# Patient Record
Sex: Male | Born: 1955 | Race: White | Hispanic: No | Marital: Married | Smoking: Current every day smoker
Health system: Southern US, Community
[De-identification: ages and names within clinical notes are randomized; demographics above are authoritative.]

## PROBLEM LIST (undated history)

## (undated) DIAGNOSIS — K279 Peptic ulcer, site unspecified, unspecified as acute or chronic, without hemorrhage or perforation: Secondary | ICD-10-CM

## (undated) DIAGNOSIS — K7460 Unspecified cirrhosis of liver: Secondary | ICD-10-CM

## (undated) DIAGNOSIS — I10 Essential (primary) hypertension: Secondary | ICD-10-CM

## (undated) DIAGNOSIS — K2990 Gastroduodenitis, unspecified, without bleeding: Secondary | ICD-10-CM

## (undated) DIAGNOSIS — E119 Type 2 diabetes mellitus without complications: Secondary | ICD-10-CM

## (undated) DIAGNOSIS — K219 Gastro-esophageal reflux disease without esophagitis: Secondary | ICD-10-CM

## (undated) HISTORY — PX: KNEE ARTHROSCOPY: SHX127

---

## 2002-02-11 ENCOUNTER — Ambulatory Visit (HOSPITAL_COMMUNITY): Admission: RE | Admit: 2002-02-11 | Discharge: 2002-02-11 | Payer: Self-pay | Admitting: Orthopaedic Surgery

## 2002-02-11 ENCOUNTER — Encounter: Payer: Self-pay | Admitting: Orthopaedic Surgery

## 2014-07-05 ENCOUNTER — Inpatient Hospital Stay (HOSPITAL_COMMUNITY)
Admission: EM | Admit: 2014-07-05 | Discharge: 2014-07-08 | DRG: 638 | Disposition: A | Discharge status: Home / Self Care | Payer: No Typology Code available for payment source | Attending: Internal Medicine | Admitting: Internal Medicine

## 2014-07-05 ENCOUNTER — Encounter (HOSPITAL_COMMUNITY): Payer: Self-pay | Admitting: Emergency Medicine

## 2014-07-05 DIAGNOSIS — D294 Benign neoplasm of scrotum: Secondary | ICD-10-CM | POA: Diagnosis present

## 2014-07-05 DIAGNOSIS — Z8711 Personal history of peptic ulcer disease: Secondary | ICD-10-CM

## 2014-07-05 DIAGNOSIS — R7402 Elevation of levels of lactic acid dehydrogenase (LDH): Secondary | ICD-10-CM | POA: Diagnosis present

## 2014-07-05 DIAGNOSIS — K7460 Unspecified cirrhosis of liver: Secondary | ICD-10-CM | POA: Diagnosis present

## 2014-07-05 DIAGNOSIS — K279 Peptic ulcer, site unspecified, unspecified as acute or chronic, without hemorrhage or perforation: Secondary | ICD-10-CM | POA: Diagnosis present

## 2014-07-05 DIAGNOSIS — Z789 Other specified health status: Secondary | ICD-10-CM

## 2014-07-05 DIAGNOSIS — K2990 Gastroduodenitis, unspecified, without bleeding: Secondary | ICD-10-CM | POA: Diagnosis present

## 2014-07-05 DIAGNOSIS — E669 Obesity, unspecified: Secondary | ICD-10-CM | POA: Diagnosis present

## 2014-07-05 DIAGNOSIS — K920 Hematemesis: Secondary | ICD-10-CM

## 2014-07-05 DIAGNOSIS — K2970 Gastritis, unspecified, without bleeding: Secondary | ICD-10-CM | POA: Diagnosis present

## 2014-07-05 DIAGNOSIS — E119 Type 2 diabetes mellitus without complications: Secondary | ICD-10-CM | POA: Diagnosis present

## 2014-07-05 DIAGNOSIS — R7401 Elevation of levels of liver transaminase levels: Secondary | ICD-10-CM | POA: Diagnosis present

## 2014-07-05 DIAGNOSIS — E1310 Other specified diabetes mellitus with ketoacidosis without coma: Principal | ICD-10-CM

## 2014-07-05 DIAGNOSIS — R1013 Epigastric pain: Secondary | ICD-10-CM | POA: Diagnosis present

## 2014-07-05 DIAGNOSIS — K219 Gastro-esophageal reflux disease without esophagitis: Secondary | ICD-10-CM | POA: Diagnosis present

## 2014-07-05 DIAGNOSIS — D72829 Elevated white blood cell count, unspecified: Secondary | ICD-10-CM | POA: Diagnosis present

## 2014-07-05 DIAGNOSIS — R740 Nonspecific elevation of levels of transaminase and lactic acid dehydrogenase [LDH]: Secondary | ICD-10-CM

## 2014-07-05 DIAGNOSIS — K7030 Alcoholic cirrhosis of liver without ascites: Secondary | ICD-10-CM

## 2014-07-05 DIAGNOSIS — F1090 Alcohol use, unspecified, uncomplicated: Secondary | ICD-10-CM | POA: Diagnosis present

## 2014-07-05 DIAGNOSIS — E861 Hypovolemia: Secondary | ICD-10-CM | POA: Diagnosis present

## 2014-07-05 DIAGNOSIS — R1011 Right upper quadrant pain: Secondary | ICD-10-CM

## 2014-07-05 DIAGNOSIS — E860 Dehydration: Secondary | ICD-10-CM | POA: Diagnosis present

## 2014-07-05 DIAGNOSIS — D696 Thrombocytopenia, unspecified: Secondary | ICD-10-CM | POA: Diagnosis present

## 2014-07-05 DIAGNOSIS — Z6829 Body mass index (BMI) 29.0-29.9, adult: Secondary | ICD-10-CM

## 2014-07-05 DIAGNOSIS — K2980 Duodenitis without bleeding: Secondary | ICD-10-CM | POA: Diagnosis present

## 2014-07-05 DIAGNOSIS — I8510 Secondary esophageal varices without bleeding: Secondary | ICD-10-CM

## 2014-07-05 DIAGNOSIS — R112 Nausea with vomiting, unspecified: Secondary | ICD-10-CM | POA: Diagnosis present

## 2014-07-05 DIAGNOSIS — I10 Essential (primary) hypertension: Secondary | ICD-10-CM | POA: Diagnosis present

## 2014-07-05 DIAGNOSIS — E1110 Type 2 diabetes mellitus with ketoacidosis without coma: Secondary | ICD-10-CM | POA: Diagnosis present

## 2014-07-05 DIAGNOSIS — D6959 Other secondary thrombocytopenia: Secondary | ICD-10-CM | POA: Diagnosis present

## 2014-07-05 DIAGNOSIS — F1020 Alcohol dependence, uncomplicated: Secondary | ICD-10-CM | POA: Diagnosis present

## 2014-07-05 DIAGNOSIS — F17200 Nicotine dependence, unspecified, uncomplicated: Secondary | ICD-10-CM | POA: Diagnosis present

## 2014-07-05 DIAGNOSIS — Z7289 Other problems related to lifestyle: Secondary | ICD-10-CM

## 2014-07-05 DIAGNOSIS — K319 Disease of stomach and duodenum, unspecified: Secondary | ICD-10-CM | POA: Diagnosis present

## 2014-07-05 HISTORY — DX: Gastroduodenitis, unspecified, without bleeding: K29.90

## 2014-07-05 HISTORY — DX: Peptic ulcer, site unspecified, unspecified as acute or chronic, without hemorrhage or perforation: K27.9

## 2014-07-05 HISTORY — DX: Gastro-esophageal reflux disease without esophagitis: K21.9

## 2014-07-05 HISTORY — DX: Type 2 diabetes mellitus without complications: E11.9

## 2014-07-05 HISTORY — DX: Unspecified cirrhosis of liver: K74.60

## 2014-07-05 HISTORY — DX: Essential (primary) hypertension: I10

## 2014-07-05 MED ORDER — SODIUM CHLORIDE 0.9 % IV BOLUS (SEPSIS)
1000.0000 mL | Freq: Once | INTRAVENOUS | Status: AC
  Administered 2014-07-06: 1000 mL via INTRAVENOUS

## 2014-07-05 MED ORDER — MORPHINE SULFATE 2 MG/ML IJ SOLN
4.0000 mg | Freq: Once | INTRAMUSCULAR | Status: AC
  Administered 2014-07-06: 4 mg via INTRAVENOUS
  Filled 2014-07-05: qty 2

## 2014-07-05 MED ORDER — ONDANSETRON HCL 4 MG/2ML IJ SOLN
4.0000 mg | Freq: Once | INTRAMUSCULAR | Status: DC
  Filled 2014-07-05: qty 2

## 2014-07-05 NOTE — ED Notes (Signed)
Pt states he has had some right lower abd pain with vomiting.  Pt states the pain got worse today
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
## 2014-07-06 ENCOUNTER — Encounter (HOSPITAL_COMMUNITY): Payer: Self-pay | Admitting: *Deleted

## 2014-07-06 ENCOUNTER — Inpatient Hospital Stay (HOSPITAL_COMMUNITY): Payer: No Typology Code available for payment source

## 2014-07-06 ENCOUNTER — Emergency Department (HOSPITAL_COMMUNITY): Payer: No Typology Code available for payment source

## 2014-07-06 DIAGNOSIS — Z7289 Other problems related to lifestyle: Secondary | ICD-10-CM | POA: Diagnosis present

## 2014-07-06 DIAGNOSIS — K7460 Unspecified cirrhosis of liver: Secondary | ICD-10-CM | POA: Diagnosis present

## 2014-07-06 DIAGNOSIS — R112 Nausea with vomiting, unspecified: Secondary | ICD-10-CM | POA: Diagnosis present

## 2014-07-06 DIAGNOSIS — D294 Benign neoplasm of scrotum: Secondary | ICD-10-CM | POA: Diagnosis present

## 2014-07-06 DIAGNOSIS — E1110 Type 2 diabetes mellitus with ketoacidosis without coma: Secondary | ICD-10-CM | POA: Diagnosis present

## 2014-07-06 DIAGNOSIS — K2980 Duodenitis without bleeding: Secondary | ICD-10-CM | POA: Diagnosis present

## 2014-07-06 DIAGNOSIS — K279 Peptic ulcer, site unspecified, unspecified as acute or chronic, without hemorrhage or perforation: Secondary | ICD-10-CM | POA: Diagnosis present

## 2014-07-06 DIAGNOSIS — R110 Nausea: Secondary | ICD-10-CM

## 2014-07-06 DIAGNOSIS — Z789 Other specified health status: Secondary | ICD-10-CM | POA: Diagnosis present

## 2014-07-06 DIAGNOSIS — R740 Nonspecific elevation of levels of transaminase and lactic acid dehydrogenase [LDH]: Secondary | ICD-10-CM

## 2014-07-06 DIAGNOSIS — K920 Hematemesis: Secondary | ICD-10-CM

## 2014-07-06 DIAGNOSIS — D696 Thrombocytopenia, unspecified: Secondary | ICD-10-CM | POA: Diagnosis present

## 2014-07-06 DIAGNOSIS — E1310 Other specified diabetes mellitus with ketoacidosis without coma: Principal | ICD-10-CM

## 2014-07-06 DIAGNOSIS — E119 Type 2 diabetes mellitus without complications: Secondary | ICD-10-CM | POA: Diagnosis present

## 2014-07-06 DIAGNOSIS — I10 Essential (primary) hypertension: Secondary | ICD-10-CM | POA: Diagnosis present

## 2014-07-06 DIAGNOSIS — R1013 Epigastric pain: Secondary | ICD-10-CM

## 2014-07-06 DIAGNOSIS — R7401 Elevation of levels of liver transaminase levels: Secondary | ICD-10-CM | POA: Diagnosis present

## 2014-07-06 DIAGNOSIS — R1011 Right upper quadrant pain: Secondary | ICD-10-CM

## 2014-07-06 LAB — BASIC METABOLIC PANEL
Anion gap: 23 — ABNORMAL HIGH (ref 5–15)
Anion gap: 18 — ABNORMAL HIGH (ref 5–15)
Anion gap: 16 — ABNORMAL HIGH (ref 5–15)
Anion gap: 11 (ref 5–15)
BUN: 22 mg/dL (ref 6–23)
BUN: 22 mg/dL (ref 6–23)
BUN: 22 mg/dL (ref 6–23)
BUN: 21 mg/dL (ref 6–23)
BUN: 20 mg/dL (ref 6–23)
CALCIUM: 9.3 mg/dL (ref 8.4–10.5)
CALCIUM: 9.4 mg/dL (ref 8.4–10.5)
CO2: 17 mEq/L — ABNORMAL LOW (ref 19–32)
CO2: 17 mEq/L — ABNORMAL LOW (ref 19–32)
CO2: 21 mEq/L (ref 19–32)
CO2: 21 mEq/L (ref 19–32)
CO2: 24 mEq/L (ref 19–32)
CREATININE: 0.86 mg/dL (ref 0.50–1.35)
CREATININE: 0.77 mg/dL (ref 0.50–1.35)
Calcium: 9.4 mg/dL (ref 8.4–10.5)
Calcium: 9.3 mg/dL (ref 8.4–10.5)
Calcium: 9.1 mg/dL (ref 8.4–10.5)
Chloride: 97 mEq/L (ref 96–112)
Chloride: 100 mEq/L (ref 96–112)
Chloride: 101 mEq/L (ref 96–112)
Chloride: 105 mEq/L (ref 96–112)
Chloride: 105 mEq/L (ref 96–112)
Creatinine, Ser: 0.87 mg/dL (ref 0.50–1.35)
Creatinine, Ser: 0.87 mg/dL (ref 0.50–1.35)
Creatinine, Ser: 0.83 mg/dL (ref 0.50–1.35)
GFR calc Af Amer: >90 mL/min (ref >90)
GFR calc Af Amer: >90 mL/min (ref >90)
GFR calc non Af Amer: >90 mL/min (ref >90)
GFR calc non Af Amer: >90 mL/min (ref >90)
GFR calc non Af Amer: >90 mL/min (ref >90)
GFR calc non Af Amer: >90 mL/min (ref >90)
GFR calc non Af Amer: >90 mL/min (ref >90)
GLUCOSE: 338 mg/dL — AB (ref 70–99)
GLUCOSE: 334 mg/dL — AB (ref 70–99)
Glucose, Bld: 278 mg/dL — ABNORMAL HIGH (ref 70–99)
Glucose, Bld: 237 mg/dL — ABNORMAL HIGH (ref 70–99)
Glucose, Bld: 179 mg/dL — ABNORMAL HIGH (ref 70–99)
POTASSIUM: 4.4 meq/L (ref 3.7–5.3)
Potassium: 4.5 mEq/L (ref 3.7–5.3)
Potassium: 4.6 mEq/L (ref 3.7–5.3)
Potassium: 4.3 mEq/L (ref 3.7–5.3)
Potassium: 4 mEq/L (ref 3.7–5.3)
Sodium: 137 mEq/L (ref 137–147)
Sodium: 140 mEq/L (ref 137–147)
Sodium: 140 mEq/L (ref 137–147)
Sodium: 142 mEq/L (ref 137–147)
Sodium: 140 mEq/L (ref 137–147)

## 2014-07-06 LAB — COMPREHENSIVE METABOLIC PANEL
ALT: 49 U/L (ref 0–53)
AST: 51 U/L — ABNORMAL HIGH (ref 0–37)
Albumin: 4.3 g/dL (ref 3.5–5.2)
Alkaline Phosphatase: 174 U/L — ABNORMAL HIGH (ref 39–117)
Anion gap: 22 — ABNORMAL HIGH (ref 5–15)
BUN: 23 mg/dL (ref 6–23)
CO2: 19 mEq/L (ref 19–32)
Calcium: 10 mg/dL (ref 8.4–10.5)
Chloride: 98 mEq/L (ref 96–112)
Creatinine, Ser: 1 mg/dL (ref 0.50–1.35)
GFR calc Af Amer: >90 mL/min (ref >90)
GFR calc non Af Amer: 82 mL/min — ABNORMAL LOW (ref >90)
Glucose, Bld: 343 mg/dL — ABNORMAL HIGH (ref 70–99)
Potassium: 4.3 mEq/L (ref 3.7–5.3)
Sodium: 139 mEq/L (ref 137–147)
Total Bilirubin: 2.3 mg/dL — ABNORMAL HIGH (ref 0.3–1.2)
Total Protein: 7.7 g/dL (ref 6.0–8.3)

## 2014-07-06 LAB — PROTIME-INR
INR: 1.28 (ref 0.00–1.49)
Prothrombin Time: 16 seconds — ABNORMAL HIGH (ref 11.6–15.2)

## 2014-07-06 LAB — URINALYSIS, ROUTINE W REFLEX MICROSCOPIC
Bilirubin Urine: NEGATIVE
HGB URINE DIPSTICK: NEGATIVE
Ketones, ur: 15 mg/dL — AB
Leukocytes, UA: NEGATIVE
Nitrite: NEGATIVE
PH: 5.5 (ref 5.0–8.0)
Protein, ur: NEGATIVE mg/dL
SPECIFIC GRAVITY, URINE: 1.01 (ref 1.005–1.030)
Urobilinogen, UA: 0.2 mg/dL (ref 0.0–1.0)

## 2014-07-06 LAB — GLUCOSE, CAPILLARY
GLUCOSE-CAPILLARY: 336 mg/dL — AB (ref 70–99)
GLUCOSE-CAPILLARY: 283 mg/dL — AB (ref 70–99)
GLUCOSE-CAPILLARY: 198 mg/dL — AB (ref 70–99)
GLUCOSE-CAPILLARY: 184 mg/dL — AB (ref 70–99)
GLUCOSE-CAPILLARY: 152 mg/dL — AB (ref 70–99)
Glucose-Capillary: 334 mg/dL — ABNORMAL HIGH (ref 70–99)
Glucose-Capillary: 289 mg/dL — ABNORMAL HIGH (ref 70–99)
Glucose-Capillary: 227 mg/dL — ABNORMAL HIGH (ref 70–99)
Glucose-Capillary: 173 mg/dL — ABNORMAL HIGH (ref 70–99)
Glucose-Capillary: 266 mg/dL — ABNORMAL HIGH (ref 70–99)
Glucose-Capillary: 190 mg/dL — ABNORMAL HIGH (ref 70–99)
Glucose-Capillary: 189 mg/dL — ABNORMAL HIGH (ref 70–99)

## 2014-07-06 LAB — HEPATITIS PANEL, ACUTE
HCV Ab: NEGATIVE
HEP B S AG: NEGATIVE
Hep A IgM: NONREACTIVE
Hep B C IgM: NONREACTIVE

## 2014-07-06 LAB — CBC WITH DIFFERENTIAL/PLATELET
Basophils Absolute: 0 10*3/uL (ref 0.0–0.1)
Basophils Relative: 0 % (ref 0–1)
Eosinophils Absolute: 0 10*3/uL (ref 0.0–0.7)
Eosinophils Relative: 0 % (ref 0–5)
HCT: 43.9 % (ref 39.0–52.0)
Hemoglobin: 15.5 g/dL (ref 13.0–17.0)
Lymphocytes Relative: 7 % — ABNORMAL LOW (ref 12–46)
Lymphs Abs: 0.8 10*3/uL (ref 0.7–4.0)
MCH: 32.3 pg (ref 26.0–34.0)
MCHC: 35.3 g/dL (ref 30.0–36.0)
MCV: 91.5 fL (ref 78.0–100.0)
Monocytes Absolute: 0.4 10*3/uL (ref 0.1–1.0)
Monocytes Relative: 4 % (ref 3–12)
Neutro Abs: 10.4 10*3/uL — ABNORMAL HIGH (ref 1.7–7.7)
Neutrophils Relative %: 89 % — ABNORMAL HIGH (ref 43–77)
Platelets: 96 10*3/uL — ABNORMAL LOW (ref 150–400)
RBC: 4.8 MIL/uL (ref 4.22–5.81)
RDW: 14.1 % (ref 11.5–15.5)
WBC: 11.6 10*3/uL — ABNORMAL HIGH (ref 4.0–10.5)

## 2014-07-06 LAB — URINE MICROSCOPIC-ADD ON

## 2014-07-06 LAB — CBC
HCT: 40.1 % (ref 39.0–52.0)
HEMOGLOBIN: 13.8 g/dL (ref 13.0–17.0)
MCH: 31.7 pg (ref 26.0–34.0)
MCHC: 34.4 g/dL (ref 30.0–36.0)
MCV: 92 fL (ref 78.0–100.0)
Platelets: 92 10*3/uL — ABNORMAL LOW (ref 150–400)
RBC: 4.36 MIL/uL (ref 4.22–5.81)
RDW: 14.3 % (ref 11.5–15.5)
WBC: 13.5 10*3/uL — ABNORMAL HIGH (ref 4.0–10.5)

## 2014-07-06 LAB — CBG MONITORING, ED
Glucose-Capillary: 312 mg/dL — ABNORMAL HIGH (ref 70–99)
Glucose-Capillary: 319 mg/dL — ABNORMAL HIGH (ref 70–99)

## 2014-07-06 LAB — TSH: TSH: 0.824 u[IU]/mL (ref 0.350–4.500)

## 2014-07-06 LAB — LIPASE, BLOOD: Lipase: 66 U/L — ABNORMAL HIGH (ref 11–59)

## 2014-07-06 LAB — BILIRUBIN, DIRECT: BILIRUBIN DIRECT: 0.9 mg/dL — AB (ref 0.0–0.3)

## 2014-07-06 LAB — HEPATITIS A ANTIBODY, TOTAL: HEP A TOTAL AB: NONREACTIVE

## 2014-07-06 LAB — HEMOGLOBIN A1C
Hgb A1c MFr Bld: 9.1 % — ABNORMAL HIGH (ref <5.7)
Mean Plasma Glucose: 214 mg/dL — ABNORMAL HIGH (ref <117)

## 2014-07-06 LAB — VITAMIN B12: VITAMIN B 12: 895 pg/mL (ref 211–911)

## 2014-07-06 LAB — HEPATITIS B SURFACE ANTIGEN: HEP B S AG: NEGATIVE

## 2014-07-06 LAB — ETHANOL

## 2014-07-06 LAB — MRSA PCR SCREENING: MRSA by PCR: NEGATIVE

## 2014-07-06 LAB — FERRITIN: Ferritin: 66 ng/mL (ref 22–322)

## 2014-07-06 LAB — HEPATITIS C ANTIBODY: HCV AB: NEGATIVE

## 2014-07-06 LAB — TROPONIN I: Troponin I: <0.3 ng/mL (ref <0.30)

## 2014-07-06 MED ORDER — SODIUM CHLORIDE 0.9 % IV SOLN
INTRAVENOUS | Status: DC
  Administered 2014-07-06: 2.6 [IU]/h via INTRAVENOUS
  Filled 2014-07-06: qty 1

## 2014-07-06 MED ORDER — MORPHINE SULFATE 2 MG/ML IJ SOLN: 2.0000 mg | INTRAMUSCULAR | Status: DC | PRN

## 2014-07-06 MED ORDER — SODIUM CHLORIDE 0.9 % IV SOLN
INTRAVENOUS | Status: DC
  Administered 2014-07-06: 05:00:00 via INTRAVENOUS

## 2014-07-06 MED ORDER — PANTOPRAZOLE SODIUM 40 MG IV SOLR: 40.0000 mg | Freq: Two times a day (BID) | INTRAVENOUS | Status: DC

## 2014-07-06 MED ORDER — PNEUMOCOCCAL VAC POLYVALENT 25 MCG/0.5ML IJ INJ
0.5000 mL | INJECTION | INTRAMUSCULAR | Status: AC
  Administered 2014-07-07: 0.5 mL via INTRAMUSCULAR
  Filled 2014-07-06: qty 0.5

## 2014-07-06 MED ORDER — PANTOPRAZOLE SODIUM 40 MG PO TBEC
40.0000 mg | DELAYED_RELEASE_TABLET | Freq: Two times a day (BID) | ORAL | Status: DC
  Administered 2014-07-06 – 2014-07-08 (×4): 40 mg via ORAL
  Filled 2014-07-06 (×4): qty 1

## 2014-07-06 MED ORDER — POTASSIUM CHLORIDE 10 MEQ/100ML IV SOLN
10.0000 meq | Freq: Once | INTRAVENOUS | Status: AC
  Administered 2014-07-06: 10 meq via INTRAVENOUS
  Filled 2014-07-06: qty 100

## 2014-07-06 MED ORDER — ONDANSETRON HCL 4 MG/2ML IJ SOLN: 4.0000 mg | Freq: Four times a day (QID) | INTRAMUSCULAR | Status: DC | PRN

## 2014-07-06 MED ORDER — IBUPROFEN 400 MG PO TABS
600.0000 mg | ORAL_TABLET | Freq: Four times a day (QID) | ORAL | Status: AC | PRN
  Administered 2014-07-06 – 2014-07-07 (×2): 600 mg via ORAL
  Filled 2014-07-06 (×2): qty 2

## 2014-07-06 MED ORDER — ALUM & MAG HYDROXIDE-SIMETH 200-200-20 MG/5ML PO SUSP
30.0000 mL | Freq: Four times a day (QID) | ORAL | Status: DC | PRN
  Administered 2014-07-08: 30 mL via ORAL
  Filled 2014-07-06: qty 30

## 2014-07-06 MED ORDER — DEXTROSE-NACL 5-0.45 % IV SOLN: INTRAVENOUS | Status: DC

## 2014-07-06 MED ORDER — SODIUM CHLORIDE 0.9 % IV BOLUS (SEPSIS)
1000.0000 mL | Freq: Once | INTRAVENOUS | Status: AC
  Administered 2014-07-06: 1000 mL via INTRAVENOUS

## 2014-07-06 MED ORDER — POTASSIUM CHLORIDE IN NACL 20-0.9 MEQ/L-% IV SOLN
INTRAVENOUS | Status: DC
  Administered 2014-07-06: 18:00:00 via INTRAVENOUS

## 2014-07-06 MED ORDER — INSULIN ASPART 100 UNIT/ML ~~LOC~~ SOLN
0.0000 [IU] | Freq: Every day | SUBCUTANEOUS | Status: DC
  Administered 2014-07-07: 2 [IU] via SUBCUTANEOUS

## 2014-07-06 MED ORDER — DEXTROSE 50 % IV SOLN: 25.0000 mL | INTRAVENOUS | Status: DC | PRN

## 2014-07-06 MED ORDER — ONDANSETRON HCL 4 MG/2ML IJ SOLN
4.0000 mg | Freq: Once | INTRAMUSCULAR | Status: AC
  Administered 2014-07-06: 4 mg via INTRAVENOUS

## 2014-07-06 MED ORDER — IOHEXOL 300 MG/ML  SOLN
50.0000 mL | Freq: Once | INTRAMUSCULAR | Status: AC | PRN
  Administered 2014-07-06: 50 mL via ORAL

## 2014-07-06 MED ORDER — INSULIN GLARGINE 100 UNIT/ML ~~LOC~~ SOLN
12.0000 [IU] | Freq: Once | SUBCUTANEOUS | Status: AC
  Administered 2014-07-06: 12 [IU] via SUBCUTANEOUS
  Filled 2014-07-06: qty 0.12

## 2014-07-06 MED ORDER — ONDANSETRON HCL 4 MG PO TABS
4.0000 mg | ORAL_TABLET | Freq: Four times a day (QID) | ORAL | Status: DC | PRN
  Administered 2014-07-08: 4 mg via ORAL
  Filled 2014-07-06: qty 1

## 2014-07-06 MED ORDER — DEXTROSE-NACL 5-0.45 % IV SOLN
INTRAVENOUS | Status: DC
  Administered 2014-07-06: 10:00:00 via INTRAVENOUS

## 2014-07-06 MED ORDER — SODIUM CHLORIDE 0.9 % IV SOLN
INTRAVENOUS | Status: DC
  Administered 2014-07-06: 11.7 [IU]/h via INTRAVENOUS
  Filled 2014-07-06: qty 1

## 2014-07-06 MED ORDER — INSULIN ASPART 100 UNIT/ML ~~LOC~~ SOLN
0.0000 [IU] | Freq: Three times a day (TID) | SUBCUTANEOUS | Status: DC
  Administered 2014-07-07: 2 [IU] via SUBCUTANEOUS
  Administered 2014-07-07: 5 [IU] via SUBCUTANEOUS
  Administered 2014-07-08 (×2): 3 [IU] via SUBCUTANEOUS

## 2014-07-06 MED ORDER — PANTOPRAZOLE SODIUM 40 MG IV SOLR
40.0000 mg | Freq: Once | INTRAVENOUS | Status: AC
  Administered 2014-07-06: 40 mg via INTRAVENOUS
  Filled 2014-07-06: qty 40

## 2014-07-06 MED ORDER — IOHEXOL 300 MG/ML  SOLN
100.0000 mL | Freq: Once | INTRAMUSCULAR | Status: AC | PRN
  Administered 2014-07-06: 100 mL via INTRAVENOUS

## 2014-07-06 MED ORDER — BIOTENE DRY MOUTH MT LIQD
15.0000 mL | Freq: Two times a day (BID) | OROMUCOSAL | Status: DC
  Administered 2014-07-06 – 2014-07-08 (×4): 15 mL via OROMUCOSAL

## 2014-07-06 MED ORDER — POTASSIUM CHLORIDE 10 MEQ/100ML IV SOLN
10.0000 meq | INTRAVENOUS | Status: AC
  Administered 2014-07-06 (×2): 10 meq via INTRAVENOUS
  Filled 2014-07-06 (×2): qty 100

## 2014-07-06 NOTE — Progress Notes (Signed)
The patient is a 58 year old man with a history of non-insulin-dependent diabetes mellitus, peptic ulcer disease, and cirrhosis, who was admitted this morning by Dr. Shanon Brow for abdominal pain, nausea, and vomiting. He was briefly examined. His vital signs, laboratory studies, and chart were reviewed. CT of his abdomen and pelvis revealed duodenal wall thickening, cirrhosis with sequelae of portal hypotension, mild splenomegaly, cholelithiasis, and porta hepatis/gastrohepatic adenopathy. His laboratory studies were consistent with DKA. His anion gap is closing. We'll continue current management as outlined by Dr. Shanon Brow. GI consult ordered and is pending. We'll continue n.p.o. status until his DKA has completely resolved, then we'll start clear liquid diet. Will order TSH and hemoglobin A1c. Hepatitis panel is pending.

## 2014-07-06 NOTE — Consult Note (Signed)
Referring Provider: No ref. provider found Primary Care Physician:  Terrial Rhodes, MD Primary Gastroenterologist:  Barney Drain  Reason for Consultation:  CIRRHOSIS   Impression: 58 YO WITH HISTORYOF CIRRHOSIS SINCE ~?2003. WAS TOLD TO STOP DRINKING ETOH BUT CONTINUES TO DRINK ETOH BUT NOT EVERY DAY. PRSENTED WITH NV/ABDOMINAL PAIN/ETOH/GOODY POWDER USE/WEIGHT LOSS. DIFFERENTIAL DIAGNOSIS INCLUDES PUD, GASTRIC OUTLET OBSTRUCTION, LESS LIKELY GASTRIC MASS.  Plan: 1. FULL LIQUID DIET 2. BID PPI 3. EGD JUL 27. CONSIDER DILATION OF PYLORUS. 4. NPO AFTER MN EXCEPT SIPS WITH MEDS 5. PHENERGAN 12.5 MG I V IN PREOP.      HPI:  TOLD HE HAD CIRRHOSIS IN 2003/4. NEVER HAD AN EGD. PRESENTED WITH NAUSEA, VOMITING, AND EPIGASTRIC PAIN FOR ~2 WEEKS. VOMITED ALL DAY YESTERDAY SO HE CAME TO THE ED. SAW A SMALL AMOUNT OF BLOOD WHEN HE VOMITED. LAST VOMITED WHEN HE ARRIVED IN THE ICU. IN 2001 HE WEIGHED 298 LBS. NAUSEA IS BETTER TODAY. BMs: QOD-YESTERDAY NL BM. MORE SOB TODAY.  PT DENIES FEVER, CHILLS, HEMATOCHEZIA, melena, diarrhea, CHEST PAIN, SHORTNESS OF BREATH,  CHANGE IN BOWEL IN HABITS, constipation, abdominal pain, problems swallowing,PROBLEMS WITH SEDATION, OR  heartburn or indigestion.    Past Medical History  Diagnosis Date  . Diabetes mellitus without complication SINCE HE WAS IN HIS 20s-30s  . Hypertension   . Hepatic cirrhosis   . Gastroesophageal reflux   . Peptic ulcer    Past Surgical History  Procedure Laterality Date  . Knee arthroscopy      Prior to Admission medications   Not on File    Current Facility-Administered Medications  Medication Dose Route Frequency Provider Last Rate Last Dose  . 0.9 %  sodium chloride infusion   Intravenous Continuous Phillips Grout, MD 125 mL/hr at 07/06/14 0515    . alum & mag hydroxide-simeth (MAALOX/MYLANTA) 200-200-20 MG/5ML suspension 30 mL  30 mL Oral Q6H PRN Phillips Grout, MD      . antiseptic oral rinse (BIOTENE) solution  15 mL  15 mL Mouth Rinse BID Rachal A Shanon Brow, MD      . dextrose 5 %-0.45 % sodium chloride infusion   Intravenous Continuous Rachal Rayvon Char, MD      . dextrose 50 % solution 25 mL  25 mL Intravenous PRN Phillips Grout, MD      . insulin regular (NOVOLIN R,HUMULIN R) 1 Units/mL in sodium chloride 0.9 % 100 mL infusion   Intravenous Continuous Phillips Grout, MD 8.9 mL/hr at 07/06/14 0650    . morphine 2 MG/ML injection 2 mg  2 mg Intravenous Q4H PRN Phillips Grout, MD      . ondansetron [facility]) tablet 4 mg  4 mg Oral Q6H PRN Phillips Grout, MD       Or  . ondansetron (ZOFRAN) injection 4 mg  4 mg Intravenous Q6H PRN Phillips Grout, MD      . pantoprazole (PROTONIX) injection 40 mg  40 mg Intravenous Q12H Phillips Grout, MD      . Derrill Memo ON 07/07/2014] pneumococcal 23 valent vaccine (PNU-IMMUNE) injection 0.5 mL  0.5 mL Intramuscular Tomorrow-1000 Phillips Grout, MD        Allergies as of 07/05/2014  . (No Known Allergies)    FAMILY HISTORY: NO COLON CA OR POLYPS, GASTRIC CA, OR PANCREAS CA.   History   Social History  . Marital Status: Married             Social History Main Topics  .  Smoking status: Current Every Day Smoker -- 0.25 packs/day for 35 years  . Smokeless tobacco: Current User  . Alcohol Use: 1.2 oz/week    2 Cans of beer per week     Comment: per day not week  . Drug Use: No  . 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  Review of Systems: DENIES PANCREAS PROBLEMS. NO TATTOOS, IVDA, ILLICIT DRUG USE, pRBCs. NO KNOWN hX HEP ABC.  PER HPI OTHERWISE ALL SYSTEMS ARE NEGATIVE.   Vitals: Blood pressure 146/78, pulse 122, temperature 98.5 F (36.9 C), temperature source Oral, resp. rate 20, height 6\' 3"  (1.905 m), weight 228 lb 9.9 oz (103.7 kg), SpO2 92.00%.  Physical Exam: General:   Alert,  Well-developed, well-nourished, pleasant and cooperative in NAD Head:  Normocephalic and atraumatic. Eyes:  Sclera  clear, no icterus.   Conjunctiva pink. Mouth:  No lesions, dentition ABnormal. Neck:  Supple; no masses. Lungs:  Clear throughout to auscultation.   No wheezes. No acute distress. Heart:  Regular rate and rhythm; no murmurs, clicks, rubs,  or gallops. Abdomen:  Soft, MILD TTP IN THE EPIGASTRIUM, Nondistended. No masses or hernias noted. Normal bowel sounds, without guarding, and without rebound.   Msk:  Symmetrical without gross deformities. Normal posture. Extremities:  Without edema. Neurologic:  Alert and  oriented x4;  grossly normal neurologically. Cervical Nodes:  No significant cervical adenopathy. Psych:  Alert and cooperative. Normal mood and affect.  Lab Results:  Recent Labs  07/06/14 0001 07/06/14 0515  WBC 11.6* 13.5*  HGB 15.5 13.8  HCT 43.9 40.1  PLT 96* 92*   BMET  Recent Labs  07/06/14 0515 07/06/14 0730  NA 140 140  K 4.5 4.6  CL 100 101  CO2 17* 21  GLUCOSE 334* 278*  BUN 22 22  CREATININE 0.87 0.86  CALCIUM 9.4 9.4   LFT  Recent Labs  07/06/14 0001  PROT 7.7  ALBUMIN 4.3  AST 51*  ALT 49  ALKPHOS 174*  BILITOT 2.3*     Studies/Results: CT ABD/PELVIS 7/25-THICKENED DUODENAL WALL   LOS: 1 day   Dinh Ayotte  07/06/2014, 8:55 AM
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
## 2014-07-07 ENCOUNTER — Encounter (HOSPITAL_COMMUNITY)
Admission: EM | Disposition: A | Discharge status: Home / Self Care | Payer: Self-pay | Source: Home / Self Care | Attending: Internal Medicine

## 2014-07-07 ENCOUNTER — Encounter (HOSPITAL_COMMUNITY): Payer: Self-pay | Admitting: *Deleted

## 2014-07-07 DIAGNOSIS — Z789 Other specified health status: Secondary | ICD-10-CM

## 2014-07-07 DIAGNOSIS — K7030 Alcoholic cirrhosis of liver without ascites: Secondary | ICD-10-CM

## 2014-07-07 HISTORY — PX: ESOPHAGOGASTRODUODENOSCOPY: SHX5428

## 2014-07-07 LAB — COMPREHENSIVE METABOLIC PANEL
ALK PHOS: 120 U/L — AB (ref 39–117)
ALT: 39 U/L (ref 0–53)
AST: 54 U/L — ABNORMAL HIGH (ref 0–37)
Albumin: 3.1 g/dL — ABNORMAL LOW (ref 3.5–5.2)
Anion gap: 8 (ref 5–15)
BILIRUBIN TOTAL: 1.8 mg/dL — AB (ref 0.3–1.2)
BUN: 16 mg/dL (ref 6–23)
CHLORIDE: 104 meq/L (ref 96–112)
CO2: 26 meq/L (ref 19–32)
Calcium: 8.2 mg/dL — ABNORMAL LOW (ref 8.4–10.5)
Creatinine, Ser: 0.7 mg/dL (ref 0.50–1.35)
GFR calc Af Amer: >90 mL/min (ref >90)
Glucose, Bld: 143 mg/dL — ABNORMAL HIGH (ref 70–99)
Potassium: 4.2 mEq/L (ref 3.7–5.3)
Sodium: 138 mEq/L (ref 137–147)
Total Protein: 5.7 g/dL — ABNORMAL LOW (ref 6.0–8.3)

## 2014-07-07 LAB — CBC
HCT: 35.2 % — ABNORMAL LOW (ref 39.0–52.0)
Hemoglobin: 12 g/dL — ABNORMAL LOW (ref 13.0–17.0)
MCH: 32.1 pg (ref 26.0–34.0)
MCHC: 34.1 g/dL (ref 30.0–36.0)
MCV: 94.1 fL (ref 78.0–100.0)
PLATELETS: 53 10*3/uL — AB (ref 150–400)
RBC: 3.74 MIL/uL — ABNORMAL LOW (ref 4.22–5.81)
RDW: 14.5 % (ref 11.5–15.5)
WBC: 6.7 10*3/uL (ref 4.0–10.5)

## 2014-07-07 LAB — GLUCOSE, CAPILLARY
GLUCOSE-CAPILLARY: 136 mg/dL — AB (ref 70–99)
GLUCOSE-CAPILLARY: 166 mg/dL — AB (ref 70–99)
Glucose-Capillary: 174 mg/dL — ABNORMAL HIGH (ref 70–99)
Glucose-Capillary: 238 mg/dL — ABNORMAL HIGH (ref 70–99)
Glucose-Capillary: 231 mg/dL — ABNORMAL HIGH (ref 70–99)

## 2014-07-07 LAB — HIV ANTIBODY (ROUTINE TESTING W REFLEX): HIV 1&2 Ab, 4th Generation: NONREACTIVE

## 2014-07-07 SURGERY — EGD (ESOPHAGOGASTRODUODENOSCOPY)
Anesthesia: Moderate Sedation

## 2014-07-07 MED ORDER — VITAMIN B-1 100 MG PO TABS
100.0000 mg | ORAL_TABLET | Freq: Every day | ORAL | Status: DC
  Administered 2014-07-07 – 2014-07-08 (×2): 100 mg via ORAL
  Filled 2014-07-07 (×2): qty 1

## 2014-07-07 MED ORDER — MIDAZOLAM HCL 5 MG/5ML IJ SOLN
INTRAMUSCULAR | Status: DC | PRN
  Administered 2014-07-07: 2 mg via INTRAVENOUS
  Administered 2014-07-07: 1 mg via INTRAVENOUS

## 2014-07-07 MED ORDER — MEPERIDINE HCL 100 MG/ML IJ SOLN
INTRAMUSCULAR | Status: DC | PRN
Start: 2014-07-07 — End: 2014-07-07
  Administered 2014-07-07: 50 mg via INTRAVENOUS

## 2014-07-07 MED ORDER — METFORMIN HCL 500 MG PO TABS
500.0000 mg | ORAL_TABLET | Freq: Two times a day (BID) | ORAL | Status: DC
  Administered 2014-07-07 – 2014-07-08 (×2): 500 mg via ORAL
  Filled 2014-07-07 (×3): qty 1

## 2014-07-07 MED ORDER — LIDOCAINE VISCOUS 2 % MT SOLN
OROMUCOSAL | Status: AC
  Filled 2014-07-07: qty 15

## 2014-07-07 MED ORDER — NADOLOL 40 MG PO TABS
20.0000 mg | ORAL_TABLET | Freq: Every day | ORAL | Status: DC
  Administered 2014-07-07 – 2014-07-08 (×2): 20 mg via ORAL
  Filled 2014-07-07 (×3): qty 1

## 2014-07-07 MED ORDER — METOPROLOL TARTRATE 25 MG PO TABS
12.5000 mg | ORAL_TABLET | Freq: Two times a day (BID) | ORAL | Status: DC
  Administered 2014-07-07: 12.5 mg via ORAL
  Filled 2014-07-07: qty 1

## 2014-07-07 MED ORDER — STERILE WATER FOR IRRIGATION IR SOLN
Status: DC | PRN
  Administered 2014-07-07: 13:00:00

## 2014-07-07 MED ORDER — SODIUM CHLORIDE 0.9 % IV SOLN
INTRAVENOUS | Status: DC
  Administered 2014-07-07: 12:00:00 via INTRAVENOUS

## 2014-07-07 MED ORDER — MIDAZOLAM HCL 5 MG/5ML IJ SOLN
INTRAMUSCULAR | Status: AC
  Filled 2014-07-07: qty 10

## 2014-07-07 MED ORDER — LIDOCAINE VISCOUS 2 % MT SOLN
OROMUCOSAL | Status: DC | PRN
  Administered 2014-07-07: 1 via OROMUCOSAL

## 2014-07-07 MED ORDER — SODIUM CHLORIDE 0.9 % IJ SOLN
INTRAMUSCULAR | Status: AC
  Filled 2014-07-07: qty 10

## 2014-07-07 MED ORDER — PROMETHAZINE HCL 25 MG/ML IJ SOLN
12.5000 mg | Freq: Once | INTRAMUSCULAR | Status: AC
  Administered 2014-07-07: 12.5 mg via INTRAVENOUS
  Filled 2014-07-07: qty 1

## 2014-07-07 MED ORDER — MEPERIDINE HCL 100 MG/ML IJ SOLN
INTRAMUSCULAR | Status: AC
  Filled 2014-07-07: qty 2

## 2014-07-07 NOTE — Progress Notes (Signed)
Inpatient Diabetes Program Recommendations  AACE/ADA: New Consensus Statement on Inpatient Glycemic Control (2013)  Target Ranges:  Prepandial:   less than 140 mg/dL      Peak postprandial:   less than 180 mg/dL (1-2 hours)      Critically ill patients:  140 - 180 mg/dL   Results for GENERAL, WEARING (MRN [id number]) as of 07/07/2014 09:41  Ref. Range 07/06/2014 08:00 07/06/2014 09:00 07/06/2014 10:00 07/06/2014 10:59 07/06/2014 12:12 07/06/2014 14:18 07/06/2014 15:35 07/06/2014 16:33 07/06/2014 21:21 07/07/2014 07:43  Glucose-Capillary Latest Range: 70-99 mg/dL 289 (H) 227 (H) 198 (H) 184 (H) 173 (H) 266 (H) 190 (H) 189 (H) 152 (H) 136 (H)   Diabetes history: DM2 Outpatient Diabetes medications: Invokana 100 mg daily, Amaryl 4 mg QAM, Metformin 1000 mg BID Current orders for Inpatient glycemic control: Novolog 0-15 units AC, Novolog 0-5 units HS, Metformin 500 mg BID  Inpatient Diabetes Program Recommendations Insulin - Basal: Noted patient received Lantus 12 units on 7/26 at 17:24. If patient remains inpatient, please consider ordering Lantus 12 units Q24H (to be started at 5pm).  Thanks, Barnie Alderman, RN, MSN, CCRN Diabetes Coordinator Inpatient Diabetes Program [phone] (Team Pager) [phone] ([facility] office) [phone] [facility] office)
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
## 2014-07-07 NOTE — Progress Notes (Signed)
UR chart review completed.  

## 2014-07-07 NOTE — Interval H&P Note (Signed)
History and Physical Interval Note:  07/07/2014 12:52 PM  Cody Brooks  has presented today for surgery, with the diagnosis of NAUSEA,  VOMITING, WEIGHT LOSS, EPIGASTRIC PAIN  The various methods of treatment have been discussed with the patient and family. After consideration of risks, benefits and other options for treatment, the patient has consented to  Procedure(s): ESOPHAGOGASTRODUODENOSCOPY (EGD) (N/A) BALLOON DILATION (N/A) as a surgical intervention .  The patient's history has been reviewed, patient examined, no change in status, stable for surgery.  I have reviewed the patient's chart and labs.  Questions were answered to the patient's satisfaction.     [employer]

## 2014-07-07 NOTE — H&P (View-Only) (Signed)
Referring Provider: No ref. provider found Primary Care Physician:  Terrial Rhodes, MD Primary Gastroenterologist:  Barney Drain  Reason for Consultation:  CIRRHOSIS   Impression: 58 YO WITH HISTORYOF CIRRHOSIS SINCE ~?2003. WAS TOLD TO STOP DRINKING ETOH BUT CONTINUES TO DRINK ETOH BUT NOT EVERY DAY. PRSENTED WITH NV/ABDOMINAL PAIN/ETOH/GOODY POWDER USE/WEIGHT LOSS. DIFFERENTIAL DIAGNOSIS INCLUDES PUD, GASTRIC OUTLET OBSTRUCTION, LESS LIKELY GASTRIC MASS.  Plan: 1. FULL LIQUID DIET 2. BID PPI 3. EGD JUL 27. CONSIDER DILATION OF PYLORUS. 4. NPO AFTER MN EXCEPT SIPS WITH MEDS 5. PHENERGAN 12.5 MG I V IN PREOP.      HPI:  TOLD HE HAD CIRRHOSIS IN 2003/4. NEVER HAD AN EGD. PRESENTED WITH NAUSEA, VOMITING, AND EPIGASTRIC PAIN FOR ~2 WEEKS. VOMITED ALL DAY YESTERDAY SO HE CAME TO THE ED. SAW A SMALL AMOUNT OF BLOOD WHEN HE VOMITED. LAST VOMITED WHEN HE ARRIVED IN THE ICU. IN 2001 HE WEIGHED 298 LBS. NAUSEA IS BETTER TODAY. BMs: QOD-YESTERDAY NL BM. MORE SOB TODAY.  PT DENIES FEVER, CHILLS, HEMATOCHEZIA, melena, diarrhea, CHEST PAIN, SHORTNESS OF BREATH,  CHANGE IN BOWEL IN HABITS, constipation, abdominal pain, problems swallowing,PROBLEMS WITH SEDATION, OR  heartburn or indigestion.    Past Medical History  Diagnosis Date  . Diabetes mellitus without complication SINCE HE WAS IN HIS 20s-30s  . Hypertension   . Hepatic cirrhosis   . Gastroesophageal reflux   . Peptic ulcer    Past Surgical History  Procedure Laterality Date  . Knee arthroscopy      Prior to Admission medications   Not on File    Current Facility-Administered Medications  Medication Dose Route Frequency Provider Last Rate Last Dose  . 0.9 %  sodium chloride infusion   Intravenous Continuous Phillips Grout, MD 125 mL/hr at 07/06/14 0515    . alum & mag hydroxide-simeth (MAALOX/MYLANTA) 200-200-20 MG/5ML suspension 30 mL  30 mL Oral Q6H PRN Phillips Grout, MD      . antiseptic oral rinse (BIOTENE) solution  15 mL  15 mL Mouth Rinse BID Rachal A Shanon Brow, MD      . dextrose 5 %-0.45 % sodium chloride infusion   Intravenous Continuous Rachal Rayvon Char, MD      . dextrose 50 % solution 25 mL  25 mL Intravenous PRN Phillips Grout, MD      . insulin regular (NOVOLIN R,HUMULIN R) 1 Units/mL in sodium chloride 0.9 % 100 mL infusion   Intravenous Continuous Phillips Grout, MD 8.9 mL/hr at 07/06/14 0650    . morphine 2 MG/ML injection 2 mg  2 mg Intravenous Q4H PRN Phillips Grout, MD      . ondansetron [facility]) tablet 4 mg  4 mg Oral Q6H PRN Phillips Grout, MD       Or  . ondansetron (ZOFRAN) injection 4 mg  4 mg Intravenous Q6H PRN Phillips Grout, MD      . pantoprazole (PROTONIX) injection 40 mg  40 mg Intravenous Q12H Phillips Grout, MD      . Derrill Memo ON 07/07/2014] pneumococcal 23 valent vaccine (PNU-IMMUNE) injection 0.5 mL  0.5 mL Intramuscular Tomorrow-1000 Phillips Grout, MD        Allergies as of 07/05/2014  . (No Known Allergies)    FAMILY HISTORY: NO COLON CA OR POLYPS, GASTRIC CA, OR PANCREAS CA.   History   Social History  . Marital Status: Married             Social History Main Topics  .  Smoking status: Current Every Day Smoker -- 0.25 packs/day for 35 years  . Smokeless tobacco: Current User  . Alcohol Use: 1.2 oz/week    2 Cans of beer per week     Comment: per day not week  . Drug Use: No  . 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  Review of Systems: DENIES PANCREAS PROBLEMS. NO TATTOOS, IVDA, ILLICIT DRUG USE, pRBCs. NO KNOWN hX HEP ABC.  PER HPI OTHERWISE ALL SYSTEMS ARE NEGATIVE.   Vitals: Blood pressure 146/78, pulse 122, temperature 98.5 F (36.9 C), temperature source Oral, resp. rate 20, height 6\' 3"  (1.905 m), weight 228 lb 9.9 oz (103.7 kg), SpO2 92.00%.  Physical Exam: General:   Alert,  Well-developed, well-nourished, pleasant and cooperative in NAD Head:  Normocephalic and atraumatic. Eyes:  Sclera  clear, no icterus.   Conjunctiva pink. Mouth:  No lesions, dentition ABnormal. Neck:  Supple; no masses. Lungs:  Clear throughout to auscultation.   No wheezes. No acute distress. Heart:  Regular rate and rhythm; no murmurs, clicks, rubs,  or gallops. Abdomen:  Soft, MILD TTP IN THE EPIGASTRIUM, Nondistended. No masses or hernias noted. Normal bowel sounds, without guarding, and without rebound.   Msk:  Symmetrical without gross deformities. Normal posture. Extremities:  Without edema. Neurologic:  Alert and  oriented x4;  grossly normal neurologically. Cervical Nodes:  No significant cervical adenopathy. Psych:  Alert and cooperative. Normal mood and affect.  Lab Results:  Recent Labs  07/06/14 0001 07/06/14 0515  WBC 11.6* 13.5*  HGB 15.5 13.8  HCT 43.9 40.1  PLT 96* 92*   BMET  Recent Labs  07/06/14 0515 07/06/14 0730  NA 140 140  K 4.5 4.6  CL 100 101  CO2 17* 21  GLUCOSE 334* 278*  BUN 22 22  CREATININE 0.87 0.86  CALCIUM 9.4 9.4   LFT  Recent Labs  07/06/14 0001  PROT 7.7  ALBUMIN 4.3  AST 51*  ALT 49  ALKPHOS 174*  BILITOT 2.3*     Studies/Results: CT ABD/PELVIS 7/25-THICKENED DUODENAL WALL   LOS: 1 day   Raylen Ken  07/06/2014, 8:55 AM

## 2014-07-07 NOTE — Op Note (Signed)
Cody Brooks, [id number]   ENDOSCOPY PROCEDURE REPORT  PATIENT: Cody Brooks, Cody Brooks.  MR#: [id number] BIRTHDATE: 1956-10-24 , 58  yrs. old GENDER: Male  ENDOSCOPIST: Barney Drain, MD REFERRED BY:   DR. Terrial Rhodes, [place], [place], [place]  PROCEDURE DATE: 07/07/2014 PROCEDURE:   EGD w/ biopsy INDICATIONS:Nausea.   Vomiting.   Weight loss.   Epigastric pain. PMHx: NSAID USE/ETOH ABUSE-MELD SCORE 11, CHILD PUGH B MEDICATIONS: PREOP: Promethazine (Phenergan) 12.5mg  IV, Demerol 50 mg IV, and Versed 3 mg IV TOPICAL ANESTHETIC:   Viscous Xylocaine  DESCRIPTION OF PROCEDURE:     Physical exam was performed.  Informed consent was obtained from the patient after explaining the benefits, risks, and alternatives to the procedure.  The patient was connected to the monitor and placed in the left lateral position.  Continuous oxygen was provided by nasal cannula and IV medicine administered through an indwelling cannula.  After administration of sedation, the patients esophagus was intubated and the [id number] ([id number])  endoscope was advanced under direct visualization to the second portion of the duodenum.  The scope was removed slowly by carefully examining the color, texture, anatomy, and integrity of the mucosa on the way out.  The patient was recovered in endoscopy and discharged home in satisfactory condition.   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  NORMNAL Z LINE.   STOMACH: Moderate gastropathy was found in the gastric fundus and cardia.  A biopsy was performed using cold forceps. Moderate non-erosive gastritis (inflammation) was found in the gastric antrum.  Multiple biopsies were performed using cold forceps.   DUODENUM: Moderate duodenal inflammation was found in the duodenal bulb and bulb and second portion of the duodenum. NO VARICES IN STOMACH OR DUODENUM. COMPLICATIONS:   None  ENDOSCOPIC IMPRESSION: 1.    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  NORMNAL Z LINE. 2.   NAUSEA, VOMITING, EPIGASTRIC PAIN & HEMATEMESIS DUE TO GASTROPATHY/GASTRITIS/DUODENITIS 3.   No SOURCE FOR WEIGHT LOSS IDENTIFIED  RECOMMENDATIONS: BID PPI ADVANCE TO DYSPHAGIA 2 CARB MOD DIET IF PT CONTINUES TO HAVE VOMITING, WOULD PERFORM GES. AWAIT BIOPSIES D/C METOPROLOL. START NADOLOL 20 MG DAILY. CONSIDER REPEAT EGD TO SCREEN FOR VARICES IN 1 YEAR IF PT CONTINUES TO DRINK ETOH AND 2 YEARS IF PT STOP DRINKING.   REPEAT EXAM:  _______________________________ Lorrin MaisBarney Drain, MD 07/07/2014 1:51 PM

## 2014-07-08 ENCOUNTER — Encounter (HOSPITAL_COMMUNITY): Payer: Self-pay | Admitting: Internal Medicine

## 2014-07-08 ENCOUNTER — Telehealth: Payer: Self-pay | Admitting: Gastroenterology

## 2014-07-08 DIAGNOSIS — I8510 Secondary esophageal varices without bleeding: Secondary | ICD-10-CM | POA: Diagnosis present

## 2014-07-08 DIAGNOSIS — K2990 Gastroduodenitis, unspecified, without bleeding: Secondary | ICD-10-CM | POA: Diagnosis present

## 2014-07-08 DIAGNOSIS — K2970 Gastritis, unspecified, without bleeding: Secondary | ICD-10-CM

## 2014-07-08 DIAGNOSIS — K7460 Unspecified cirrhosis of liver: Secondary | ICD-10-CM

## 2014-07-08 HISTORY — DX: Gastroduodenitis, unspecified, without bleeding: K29.90

## 2014-07-08 LAB — BASIC METABOLIC PANEL
ANION GAP: 12 (ref 5–15)
BUN: 15 mg/dL (ref 6–23)
CO2: 24 meq/L (ref 19–32)
CREATININE: 0.6 mg/dL (ref 0.50–1.35)
Calcium: 8.4 mg/dL (ref 8.4–10.5)
Chloride: 102 mEq/L (ref 96–112)
GFR calc Af Amer: >90 mL/min (ref >90)
GFR calc non Af Amer: >90 mL/min (ref >90)
Glucose, Bld: 234 mg/dL — ABNORMAL HIGH (ref 70–99)
POTASSIUM: 4.5 meq/L (ref 3.7–5.3)
Sodium: 138 mEq/L (ref 137–147)

## 2014-07-08 LAB — GLUCOSE, CAPILLARY
Glucose-Capillary: 191 mg/dL — ABNORMAL HIGH (ref 70–99)
Glucose-Capillary: 198 mg/dL — ABNORMAL HIGH (ref 70–99)

## 2014-07-08 LAB — CBC
HCT: 40.4 % (ref 39.0–52.0)
HEMOGLOBIN: 13.7 g/dL (ref 13.0–17.0)
MCH: 31.6 pg (ref 26.0–34.0)
MCHC: 33.9 g/dL (ref 30.0–36.0)
MCV: 93.1 fL (ref 78.0–100.0)
Platelets: 61 10*3/uL — ABNORMAL LOW (ref 150–400)
RBC: 4.34 MIL/uL (ref 4.22–5.81)
RDW: 13.8 % (ref 11.5–15.5)
WBC: 7.7 10*3/uL (ref 4.0–10.5)

## 2014-07-08 MED ORDER — PANTOPRAZOLE SODIUM 40 MG PO TBEC: 40.0000 mg | DELAYED_RELEASE_TABLET | Freq: Two times a day (BID) | ORAL | Status: AC

## 2014-07-08 MED ORDER — NADOLOL 20 MG PO TABS: 20.0000 mg | ORAL_TABLET | Freq: Every day | ORAL | Status: AC

## 2014-07-08 MED ORDER — POTASSIUM CHLORIDE CRYS ER 20 MEQ PO TBCR: 10.0000 meq | EXTENDED_RELEASE_TABLET | Freq: Every day | ORAL | Status: AC

## 2014-07-08 NOTE — Progress Notes (Signed)
Pt a/o.vss. Saline lock removed. Denies pain. Up ad lib. Discharge instructions given. Prescriptions given. Pt left floor via wheelchair and nursing staff.

## 2014-07-08 NOTE — Telephone Encounter (Signed)
Please have patient return for outpatient hospital follow-up in 4 weeks.

## 2014-07-08 NOTE — Progress Notes (Signed)
Subjective: Denies N/V, abdominal pain. Tolerating diet. No further evidence of hematemesis.   Objective: Vital signs in last 24 hours: Temp:  [97.7 F (36.5 C)-98.5 F (36.9 C)] 98.5 F (36.9 C) (07/28 0400) Pulse Rate:  [54-79] 66 (07/28 0700) Resp:  [10-22] 13 (07/28 0700) BP: (89-151)/(45-100) 148/78 mmHg (07/28 0700) SpO2:  [92 %-100 %] 95 % (07/28 0700) Weight:  [228 lb (103.42 kg)-239 lb 3.2 oz (108.5 kg)] 239 lb 3.2 oz (108.5 kg) (07/28 0500) Last BM Date: 07/05/14 General:   Alert and oriented, pleasant Head:  Normocephalic and atraumatic. Eyes:  No icterus, sclera clear. Conjuctiva pink.  Abdomen:  Bowel sounds present, soft, non-tender, full.   No HSM noted.  Msk:  Symmetrical without gross deformities. Normal posture. Extremities:  Without edema. Neurologic:  Alert and  oriented x4 Psych:  Alert and cooperative. Normal mood and affect.  Intake/Output from previous day: 07/27 0701 - 07/28 0700 In: 1601 [P.O.:600; I.V.:1125] Out: 3801 [Urine:3800; Stool:1] Intake/Output this shift:    Lab Results:  Recent Labs  07/06/14 0515 07/07/14 0540 07/08/14 0425  WBC 13.5* 6.7 7.7  HGB 13.8 12.0* 13.7  HCT 40.1 35.2* 40.4  PLT 92* 53* 61*   BMET  Recent Labs  07/06/14 1212 07/07/14 0540 07/08/14 0425  NA 140 138 138  K 4.0 4.2 4.5  CL 105 104 102  CO2 24 26 24   GLUCOSE 179* 143* 234*  BUN 20 16 15   CREATININE 0.77 0.70 0.60  CALCIUM 9.1 8.2* 8.4   LFT  Recent Labs  07/06/14 0001 07/06/14 0730 07/07/14 0540  PROT 7.7  --  5.7*  ALBUMIN 4.3  --  3.1*  AST 51*  --  54*  ALT 49  --  39  ALKPHOS 174*  --  120*  BILITOT 2.3*  --  1.8*  BILIDIR  --  0.9*  --    PT/INR  Recent Labs  07/06/14 1212  LABPROT 16.0*  INR 1.28   Hepatitis Panel  Recent Labs  07/06/14 0515 07/06/14 0730  HEPBSAG NEGATIVE NEGATIVE  HCVAB NEGATIVE NEGATIVE  HEPAIGM NON REACTIVE  --   HEPBIGM NON REACTIVE  --      Studies/Results: US Abdomen  Complete  07/06/2014   CLINICAL DATA:  Abdominal pain ; known cirrhosis  EXAM: ULTRASOUND ABDOMEN COMPLETE  COMPARISON:  CT abdomen and pelvis July 06, 2014  FINDINGS: Gallbladder:  There are multiple echogenic foci in the gallbladder which move and shadow consistent with gallstones. Largest individual gallstone measures 1.4 cm in length. Gallbladder wall is thickened. There is no pericholecystic fluid appreciable. No sonographic Murphy sign noted.  Common bile duct:  Diameter: 3 mm. There is no intrahepatic, common hepatic, or common bile duct dilatation.  Liver:  No focal lesion identified. Liver is enlarged measuring 17.5 cm in length. The liver echogenicity is inhomogeneous with a lobular contour to the liver consistent with known cirrhosis.  IVC:  No abnormality visualized.  Pancreas:  Visualized portion unremarkable. Portions of the pancreatic tail are obscured by gas.  Spleen:  Spleen measures 13.2 x 14.1 x 6.2 cm with a measured volume of 613 cubic cm, enlarged. No focal splenic lesions are identified.  Right Kidney:  Length: 13.2 cm. Echogenicity within normal limits. No mass or hydronephrosis visualized.  Left Kidney:  Length: 13.2 cm. Echogenicity within normal limits. No hydronephrosis. There is a simple cyst arising from the lower pole measuring 3.2 x 3.3 x 3.3 cm. There is a nonobstructing calculus  in the upper pole region measuring 6 x 3 mm.  Abdominal aorta:  No aneurysm visualized. There is multifocal atherosclerotic change in the aorta.  Other findings:  No demonstrable ascites.  IMPRESSION: Cholelithiasis with gallbladder wall thickening. Suspect a degree of cholecystitis.  Liver and spleen are enlarged. The liver has an appearance consistent with known cirrhosis. While no focal liver lesions are identified, it must be cautioned that the sensitivity of ultrasound for focal liver lesions is diminished given underlying parenchymal liver disease/cirrhosis.  Portions of the pancreatic tail are  obscured by gas. Visualized portions of pancreas appear normal.  Nonobstructing calculus upper pole left kidney. Simple cyst arising from lower pole left kidney. Kidneys bilaterally otherwise appear normal.  Extensive atherosclerotic change in aorta.   Electronically Signed   By: Lowella Grip M.D.   On: 07/06/2014 11:57    Assessment: 58 year old male with cirrhosis, likely secondary to chronic ETOH abuse+/-fatty liver, presenting with N/V, abdominal pain, hematemesis with CT revealing question of duodenal wall thickening. EGD with Grade 1 to 2 esophageal varices, gastritis. Biopsies pending. Clinically improved since admission from a GI standpoint. N/V likely multifactorial with known diabetes. GES if persistent N/V; however, clinically improved without further symptoms.   Plan: BID PPI Dysphagia 2, carb modified diet If further N/V proceed with GES Nadolol 20 mg daily Follow-up on pending biopsies.  Outpatient colonoscopy Outpatient cirrhosis care and further evaluation as appropriate Consider repeat EGD in 1 year if persistent ETOH use or 2 years if abstains from ETOH  Follow peripherally  Orvil Feil, ANP-BC [facility]    LOS: 3 days    07/08/2014, 7:54 AM
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
## 2014-07-09 ENCOUNTER — Encounter: Payer: Self-pay | Admitting: Gastroenterology

## 2014-07-09 NOTE — Telephone Encounter (Signed)
Pt is aware of OV on 9/2 at 1030 with AS and appt card was mailed

## 2014-07-11 ENCOUNTER — Encounter (HOSPITAL_COMMUNITY): Payer: Self-pay | Admitting: Gastroenterology

## 2014-07-28 NOTE — Telephone Encounter (Signed)
PLEASE CALL PT. HIS stomach Bx showed H. Pylori infection. He needs AMOXICILLIN 500 mg 2 po BID for 10 days and Biaxin 500 mg po bid for 10 days, #qs, rfx0. TAKE Omeprazole 20 mg BID for 10 days then 1 po dAILY forEVER. DO NOT TAKE LIPITOR WHILE TAKING THE ANTIBIOTICS. Med side effects include NVD, abd pain, and metallic taste.

## 2014-08-13 ENCOUNTER — Ambulatory Visit: Payer: No Typology Code available for payment source | Admitting: Gastroenterology

## 2014-09-03 ENCOUNTER — Encounter: Payer: Self-pay | Admitting: Gastroenterology

## 2014-09-03 ENCOUNTER — Ambulatory Visit: Payer: No Typology Code available for payment source | Admitting: Gastroenterology

## 2015-02-09 IMAGING — US US ABDOMEN COMPLETE
1 series · 13 of 25 positions shown · non-contrast
Comparison: CT abdomen and pelvis July 06, 2014

CLINICAL DATA: Abdominal pain ; known cirrhosis

EXAM:
ULTRASOUND ABDOMEN COMPLETE

[Series 1: us abdomen complete · 0.26mm/px · 13 of 108 slices shown]
[im 1/108]
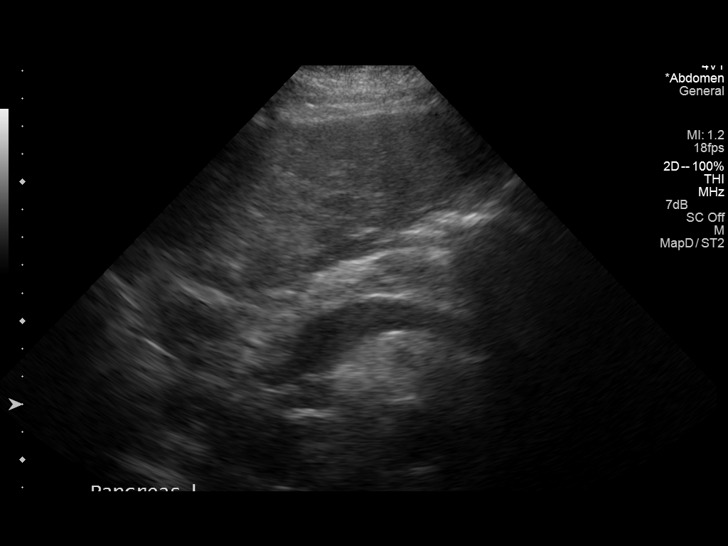
[im 9/108]
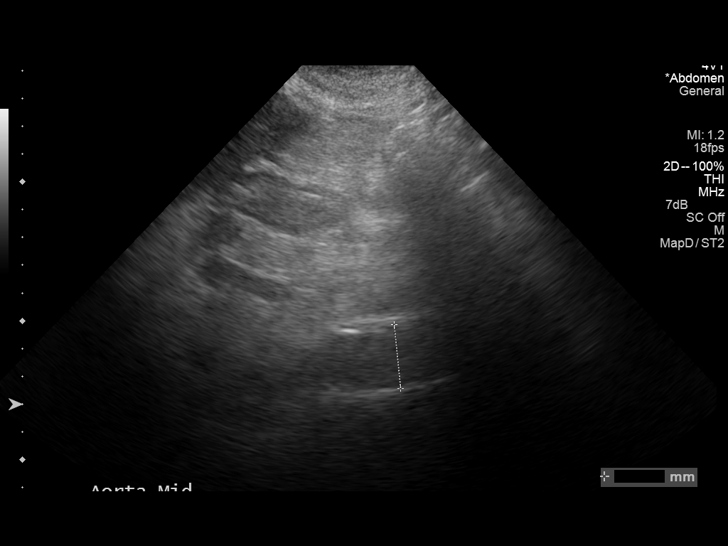
[im 18/108]
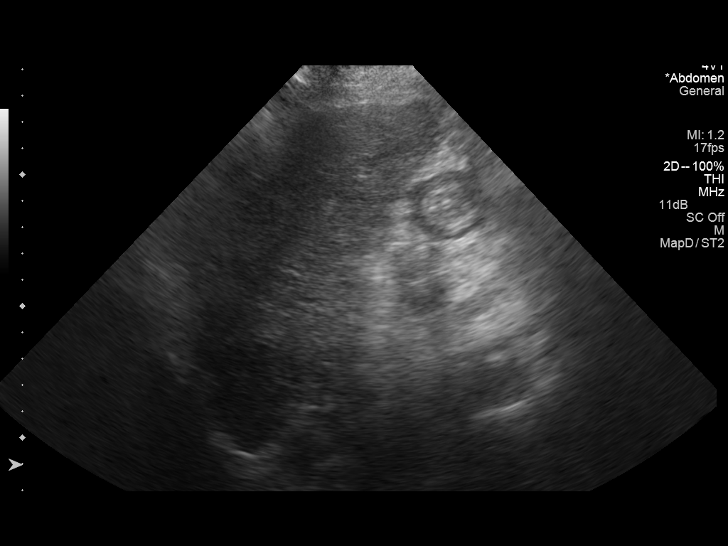
[im 27/108]
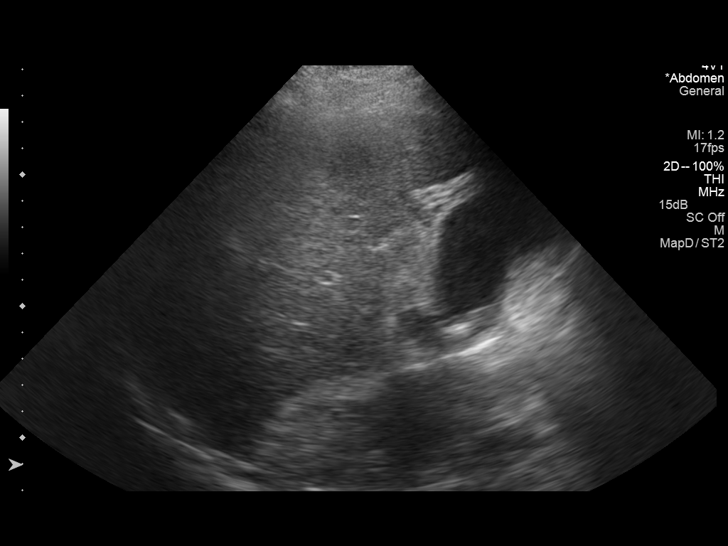
[im 36/108]
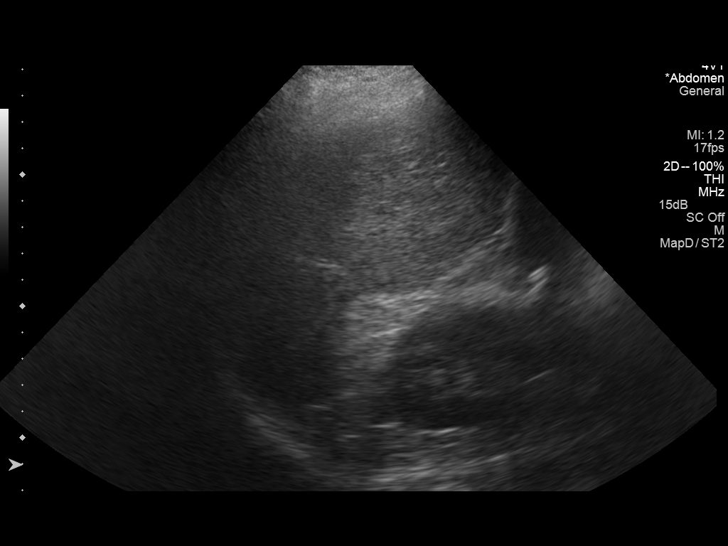
[im 45/108]
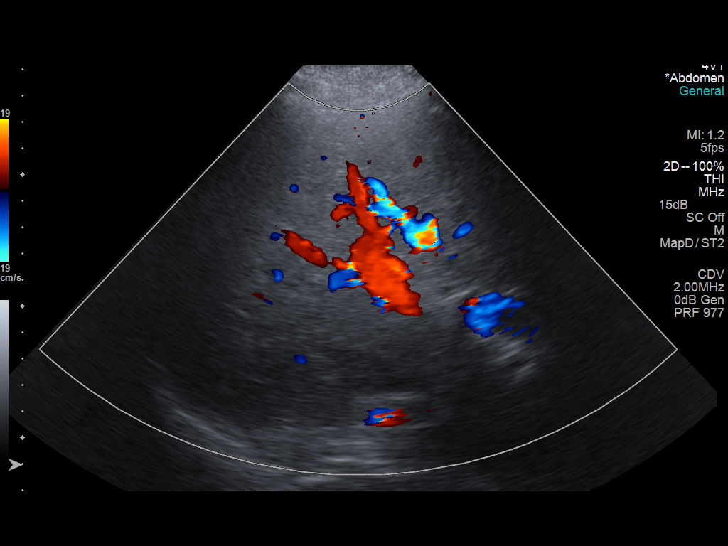
[im 54/108]
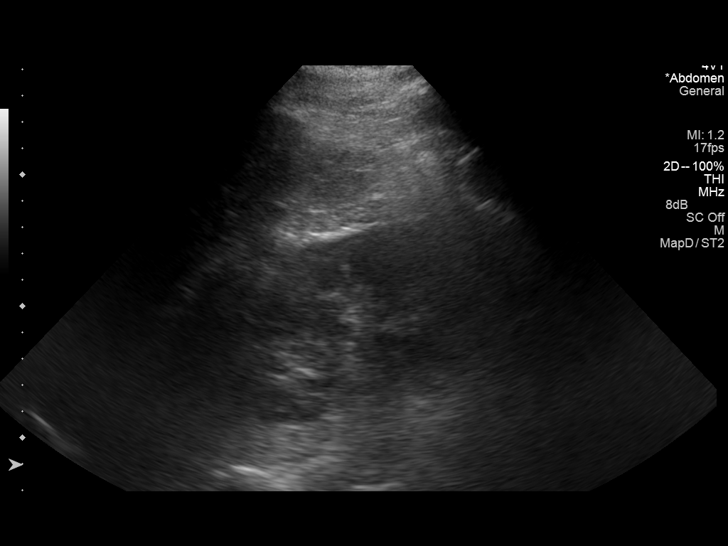
[im 63/108]
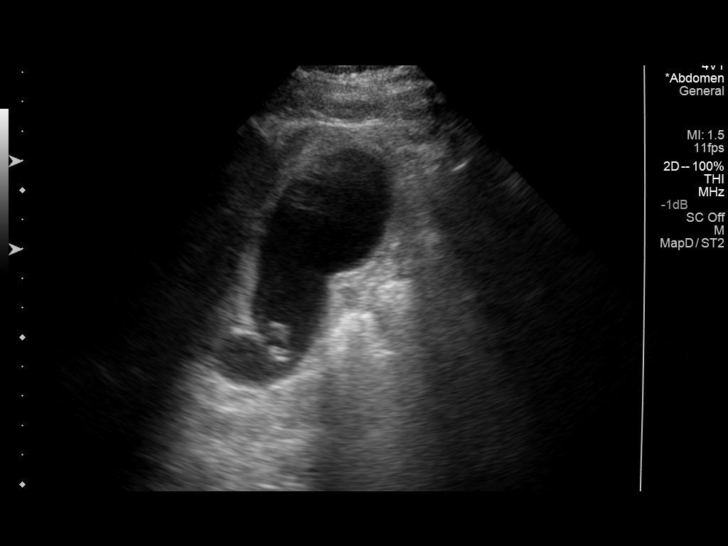
[im 72/108]
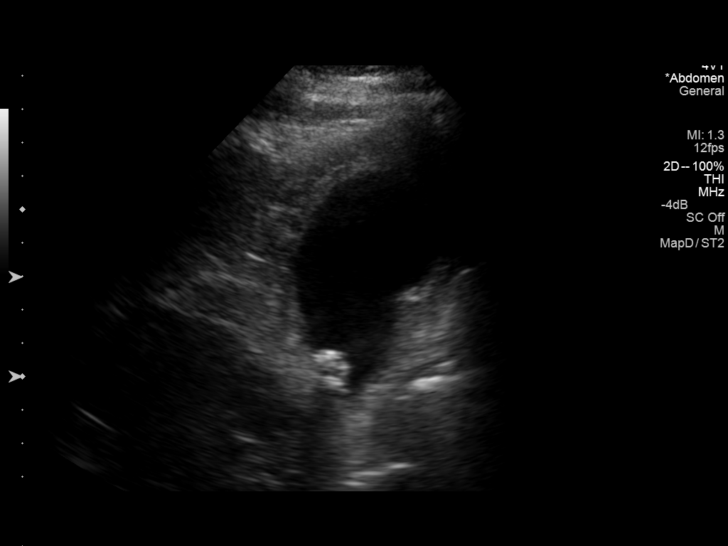
[im 81/108]
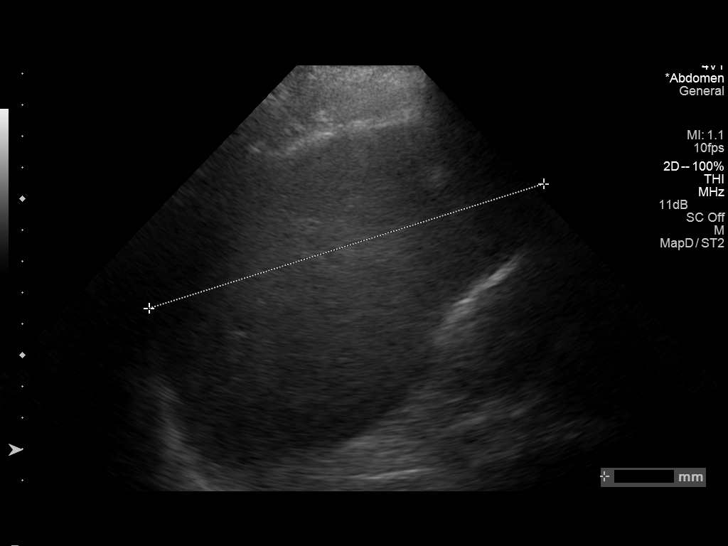
[im 90/108]
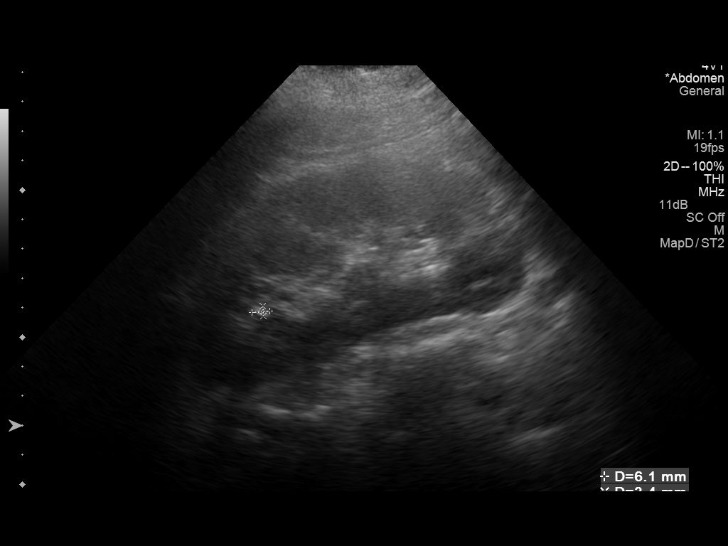
[im 99/108]
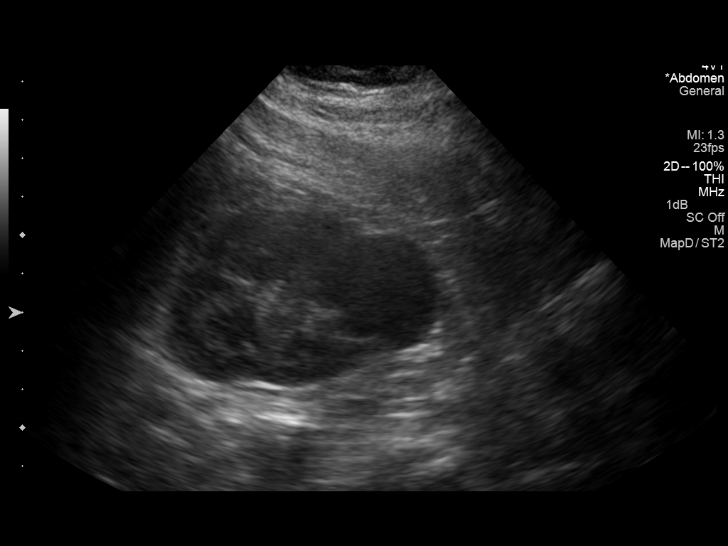
[im 108/108]
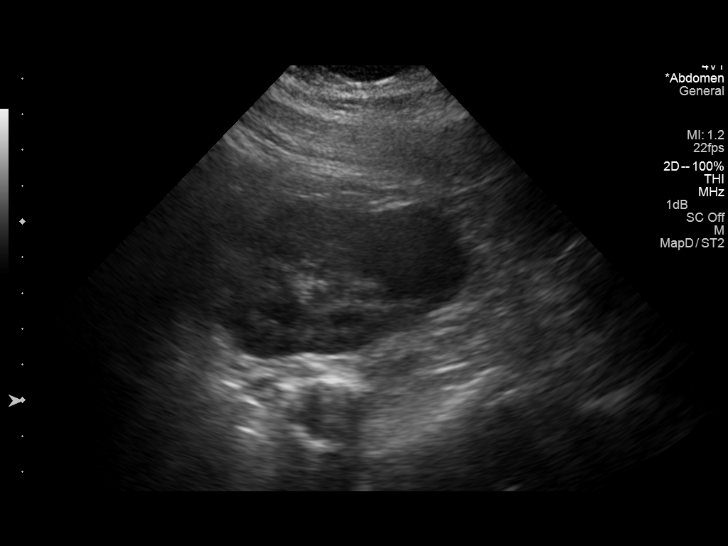

[13 of 25 positions shown; findings below may reference images not displayed]

FINDINGS: Gallbladder:

There are multiple echogenic foci in the gallbladder which move and
shadow consistent with gallstones. Largest individual gallstone
measures 1.4 cm in length. Gallbladder wall is thickened. There is
no pericholecystic fluid appreciable. No sonographic Murphy sign
noted.

Common bile duct:

Diameter: 3 mm. There is no intrahepatic, common hepatic, or common
bile duct dilatation.

Liver:

No focal lesion identified. Liver is enlarged measuring 17.5 cm in
length. The liver echogenicity is inhomogeneous with a lobular
contour to the liver consistent with known cirrhosis.

IVC:

No abnormality visualized.

Pancreas:

Visualized portion unremarkable. Portions of the pancreatic tail are
obscured by gas.

Spleen:

Spleen measures 13.2 x 14.1 x 6.2 cm with a measured volume of 613
cubic cm, enlarged. No focal splenic lesions are identified.

Right Kidney:

Length: 13.2 cm. Echogenicity within normal limits. No mass or
hydronephrosis visualized.

Left Kidney:

Length: 13.2 cm. Echogenicity within normal limits. No
hydronephrosis. There is a simple cyst arising from the lower pole
measuring 3.2 x 3.3 x 3.3 cm. There is a nonobstructing calculus in
the upper pole region measuring 6 x 3 mm.

Abdominal aorta:

No aneurysm visualized. There is multifocal atherosclerotic change
in the aorta.

Other findings:

No demonstrable ascites.
IMPRESSION: Cholelithiasis with gallbladder wall thickening. Suspect a degree of
cholecystitis.

Liver and spleen are enlarged. The liver has an appearance
consistent with known cirrhosis. While no focal liver lesions are
identified, it must be cautioned that the sensitivity of ultrasound
for focal liver lesions is diminished given underlying parenchymal
liver disease/cirrhosis.

Portions of the pancreatic tail are obscured by gas. Visualized
portions of pancreas appear normal.

Nonobstructing calculus upper pole left kidney. Simple cyst arising
from lower pole left kidney. Kidneys bilaterally otherwise appear
normal.

Extensive atherosclerotic change in aorta.

## 2015-05-27 ENCOUNTER — Encounter: Payer: Self-pay | Admitting: Gastroenterology

## 2016-09-11 DEATH — deceased
